# Patient Record
Sex: Female | Born: 1960 | Race: Black or African American | Hispanic: No | Marital: Single | Smoking: Current every day smoker
Health system: Southern US, Community
[De-identification: ages and names within clinical notes are randomized; demographics above are authoritative.]

## PROBLEM LIST (undated history)

## (undated) DIAGNOSIS — I10 Essential (primary) hypertension: Secondary | ICD-10-CM

## (undated) DIAGNOSIS — E119 Type 2 diabetes mellitus without complications: Secondary | ICD-10-CM

## (undated) DIAGNOSIS — U071 COVID-19: Secondary | ICD-10-CM

## (undated) DIAGNOSIS — G8929 Other chronic pain: Secondary | ICD-10-CM

## (undated) HISTORY — PX: BACK SURGERY: SHX140

---

## 2020-06-23 ENCOUNTER — Ambulatory Visit
Admission: EM | Admit: 2020-06-23 | Discharge: 2020-06-23 | Disposition: A | Discharge status: Home / Self Care | Payer: Medicaid Other

## 2020-06-23 ENCOUNTER — Other Ambulatory Visit: Payer: Self-pay

## 2020-06-23 ENCOUNTER — Encounter: Payer: Self-pay | Admitting: Emergency Medicine

## 2020-06-23 DIAGNOSIS — J069 Acute upper respiratory infection, unspecified: Secondary | ICD-10-CM | POA: Diagnosis not present

## 2020-06-23 HISTORY — DX: Essential (primary) hypertension: I10

## 2020-06-23 HISTORY — DX: Type 2 diabetes mellitus without complications: E11.9

## 2020-06-23 HISTORY — DX: Other chronic pain: G89.29

## 2020-06-23 MED ORDER — DEXAMETHASONE SODIUM PHOSPHATE 10 MG/ML IJ SOLN
10.0000 mg | Freq: Once | INTRAMUSCULAR | Status: AC
  Administered 2020-06-23: 10 mg via INTRAMUSCULAR

## 2020-06-23 MED ORDER — GUAIFENESIN-CODEINE 100-10 MG/5ML PO SOLN: 5.0000 mL | Freq: Every evening | ORAL | 0 refills | Status: DC | PRN

## 2020-06-23 MED ORDER — BENZONATATE 200 MG PO CAPS: 200.0000 mg | ORAL_CAPSULE | Freq: Three times a day (TID) | ORAL | 0 refills | Status: DC | PRN

## 2020-06-23 MED ORDER — ALBUTEROL SULFATE HFA 108 (90 BASE) MCG/ACT IN AERS
2.0000 | INHALATION_SPRAY | Freq: Once | RESPIRATORY_TRACT | Status: AC
  Administered 2020-06-23: 2 via RESPIRATORY_TRACT

## 2020-06-23 MED ORDER — PREDNISONE 20 MG PO TABS: 40.0000 mg | ORAL_TABLET | Freq: Every day | ORAL | 0 refills | Status: DC

## 2020-06-23 NOTE — ED Triage Notes (Signed)
Pt here with cough and congestion; pain with cough; pt sts started yesterday; pt sts fever this am; none noted at present; pt speaking is whisper at present

## 2020-06-23 NOTE — Discharge Instructions (Addendum)
We gave you a shot of Decadron, continue with prednisone 40 mg daily for 5 days, begin tomorrow morning Albuterol inhaler 1 to 2 puffs every 4-6 hours as needed for shortness of breath, chest tightness and wheezing Continue Flonase and loratadine Tessalon every 8 hours for cough during the day Robitussin with codeine at bedtime Rest and fluids Follow-up if not improving or worsening

## 2020-06-24 ENCOUNTER — Telehealth: Payer: Self-pay | Admitting: Emergency Medicine

## 2020-06-24 MED ORDER — BENZONATATE 200 MG PO CAPS: 200.0000 mg | ORAL_CAPSULE | Freq: Three times a day (TID) | ORAL | 0 refills | Status: AC | PRN

## 2020-06-24 MED ORDER — GUAIFENESIN-CODEINE 100-10 MG/5ML PO SOLN: 5.0000 mL | Freq: Every evening | ORAL | 0 refills | Status: AC | PRN

## 2020-06-24 MED ORDER — PREDNISONE 20 MG PO TABS: 40.0000 mg | ORAL_TABLET | Freq: Every day | ORAL | 0 refills | Status: AC

## 2020-06-24 NOTE — Telephone Encounter (Signed)
Resending rx for visit yesterday

## 2020-06-24 NOTE — ED Provider Notes (Signed)
[name]    CSN: [id number] Arrival date & time: 06/23/20  1453      History   Chief Complaint Chief Complaint  Patient presents with  . Cough    HPI Tracy Wall is a 60 y.o. female history of hypertension, diabetes presenting today for evaluation of cough.  Reports reading yesterday she developed sinus pressure discomfort, congestion and cough.  Reports associated chest discomfort with coughing.  Voice has become hoarse.  Denies history of asthma or underlying lung problems.  Denies any fevers.  Reports she is new to the area and believe this may be related to the pollen as well as she recently was moving and around a lot of boxes/dust.  HPI  Past Medical History:  Diagnosis Date  . Chronic pain   . Diabetes mellitus without complication (HCC)   . Hypertension     There are no problems to display for this patient.   Past Surgical History:  Procedure Laterality Date  . BACK SURGERY      OB History   No obstetric history on file.      Home Medications    Prior to Admission medications   Medication Sig Start Date End Date Taking? Authorizing Provider  amLODipine (NORVASC) 10 MG tablet Take 10 mg by mouth daily.   Yes [provider]  aspirin 81 MG chewable tablet Chew 81 mg by mouth daily.   Yes [provider]  atorvastatin (LIPITOR) 20 MG tablet Take 20 mg by mouth daily.   Yes [provider]  gabapentin (NEURONTIN) 100 MG capsule Take 100 mg by mouth 3 (three) times daily.   Yes [provider]  levocetirizine (XYZAL) 5 MG tablet Take 5 mg by mouth every evening.   Yes [provider]  metFORMIN (GLUCOPHAGE) 500 MG tablet Take by mouth 2 (two) times daily with a meal.   Yes [provider]  metoprolol succinate (TOPROL-XL) 100 MG 24 hr tablet Take 100 mg by mouth daily. Take with or immediately following a meal.   Yes [provider]  benzonatate (TESSALON) 200 MG capsule Take 1  capsule (200 mg total) by mouth 3 (three) times daily as needed for up to 7 days for cough. 06/23/20 06/30/20  Shuronda Santino C, PA-C  guaiFENesin-codeine 100-10 MG/5ML syrup Take 5-10 mLs by mouth at bedtime as needed for cough. 06/23/20   Merary Garguilo C, PA-C  predniSONE (DELTASONE) 20 MG tablet Take 2 tablets (40 mg total) by mouth daily with breakfast for 5 days. 06/23/20 06/28/20  Ares Tegtmeyer, Junius Creamer, PA-C    Family History Family History  Family history unknown: Yes    Social History Social History   Tobacco Use  . Smoking status: Current Every Day Smoker  . Smokeless tobacco: Never Used  Substance Use Topics  . Alcohol use: Not Currently  . Drug use: Never     Allergies   Patient has no known allergies.   Review of Systems Review of Systems  Constitutional: Negative for activity change, appetite change, chills, fatigue and fever.  HENT: Positive for congestion and rhinorrhea. Negative for ear pain, sinus pressure, sore throat and trouble swallowing.   Eyes: Negative for discharge and redness.  Respiratory: Positive for cough and chest tightness. Negative for shortness of breath.   Cardiovascular: Negative for chest pain.  Gastrointestinal: Negative for abdominal pain, diarrhea, nausea and vomiting.  Musculoskeletal: Negative for myalgias.  Skin: Negative for rash.  Neurological: Negative for dizziness, light-headedness and headaches.  Physical Exam Triage Vital Signs ED Triage Vitals [06/23/20 1503]  Enc Vitals Group     BP 122/71     Pulse Rate 84     Resp 20     Temp 98.3 F (36.8 C)     Temp Source Oral     SpO2 95 %     Weight      Height      Head Circumference      Peak Flow      Pain Score 10     Pain Loc      Pain Edu?      Excl. in GC?    No data found.  Updated Vital Signs BP 122/71 (BP Location: Right Arm)   Pulse 84   Temp 98.3 F (36.8 C) (Oral)   Resp 20   SpO2 95%   Visual Acuity Right Eye Distance:   Left Eye Distance:    Bilateral Distance:    Right Eye Near:   Left Eye Near:    Bilateral Near:     Physical Exam Vitals and nursing note reviewed.  Constitutional:      Appearance: She is well-developed.     Comments: No acute distress  HENT:     Head: Normocephalic and atraumatic.     Ears:     Comments: Bilateral ears without tenderness to palpation of external auricle, tragus and mastoid, EAC's without erythema or swelling, TM's with good bony landmarks and cone of light. Non erythematous.     Nose: Nose normal.     Mouth/Throat:     Comments: Oral mucosa pink and moist, no tonsillar enlargement or exudate. Posterior pharynx patent and nonerythematous, no uvula deviation or swelling. Normal phonation. Eyes:     Conjunctiva/sclera: Conjunctivae normal.  Cardiovascular:     Rate and Rhythm: Normal rate.  Pulmonary:     Effort: Pulmonary effort is normal. No respiratory distress.     Comments: Breathing comfortably at rest, CTABL, wheeze/bronchospasm noted with coughing Abdominal:     General: There is no distension.  Musculoskeletal:        General: Normal range of motion.     Cervical back: Neck supple.  Skin:    General: Skin is warm and dry.  Neurological:     Mental Status: She is alert and oriented to person, place, and time.      UC Treatments / Results  Labs (all labs ordered are listed, but only abnormal results are displayed) Labs Reviewed  NOVEL CORONAVIRUS, NAA    EKG   Radiology No results found.  Procedures Procedures (including critical care time)  Medications Ordered in UC Medications  albuterol (VENTOLIN HFA) 108 (90 Base) MCG/ACT inhaler 2 puff (2 puffs Inhalation Given 06/23/20 1625)  dexamethasone (DECADRON) injection 10 mg (10 mg Intramuscular Given 06/23/20 1625)    Initial Impression / Assessment and Plan / UC Course  I have reviewed the triage vital signs and the nursing notes.  Pertinent labs & imaging results that were available during my care of  the patient were reviewed by me and considered in my medical decision making (see chart for details).     Viral URI with cough, possible early bronchitis- recommending symptomatic and supportive care, COVID test pending, provided albuterol inhaler and dose of Decadron prior to discharge as well as continuing on Tessalon for daytime cough, Robitussin with codeine for bedtime.  Discussed further recommendations.  Continue to monitor breathing and symptoms,Discussed strict return precautions. Patient verbalized understanding and  is agreeable with plan.  Final Clinical Impressions(s) / UC Diagnoses   Final diagnoses:  Viral URI with cough     Discharge Instructions     We gave you a shot of Decadron, continue with prednisone 40 mg daily for 5 days, begin tomorrow morning Albuterol inhaler 1 to 2 puffs every 4-6 hours as needed for shortness of breath, chest tightness and wheezing Continue Flonase and loratadine Tessalon every 8 hours for cough during the day Robitussin with codeine at bedtime Rest and fluids Follow-up if not improving or worsening     ED Prescriptions    Medication Sig Dispense Auth. Provider   benzonatate (TESSALON) 200 MG capsule  (Status: Discontinued) Take 1 capsule (200 mg total) by mouth 3 (three) times daily as needed for up to 7 days for cough. 28 capsule Bader Stubblefield C, PA-C   guaiFENesin-codeine 100-10 MG/5ML syrup  (Status: Discontinued) Take 5-10 mLs by mouth at bedtime as needed for cough. 120 mL Ignacio Lowder C, PA-C   predniSONE (DELTASONE) 20 MG tablet  (Status: Discontinued) Take 2 tablets (40 mg total) by mouth daily with breakfast for 5 days. 10 tablet Faizan Geraci C, PA-C   benzonatate (TESSALON) 200 MG capsule Take 1 capsule (200 mg total) by mouth 3 (three) times daily as needed for up to 7 days for cough. 28 capsule Destanee Bedonie C, PA-C   guaiFENesin-codeine 100-10 MG/5ML syrup Take 5-10 mLs by mouth at bedtime as needed for cough. 120 mL  Marguerite Jarboe C, PA-C   predniSONE (DELTASONE) 20 MG tablet Take 2 tablets (40 mg total) by mouth daily with breakfast for 5 days. 10 tablet Moustafa Mossa, [place] C, PA-C     PDMP not reviewed this encounter.   Lew Dawes, [place] 06/24/20 [phone]

## 2020-06-25 LAB — NOVEL CORONAVIRUS, NAA: SARS-CoV-2, NAA: NOT DETECTED

## 2020-06-25 LAB — SARS-COV-2, NAA 2 DAY TAT

## 2021-04-18 ENCOUNTER — Other Ambulatory Visit: Payer: Self-pay

## 2021-04-18 ENCOUNTER — Emergency Department (HOSPITAL_COMMUNITY): Payer: Medicaid Other

## 2021-04-18 ENCOUNTER — Encounter (HOSPITAL_COMMUNITY): Payer: Self-pay

## 2021-04-18 ENCOUNTER — Emergency Department (HOSPITAL_COMMUNITY)
Admission: EM | Admit: 2021-04-18 | Discharge: 2021-04-18 | Discharge status: Against Medical Advice | Payer: Medicaid Other | Attending: Emergency Medicine | Admitting: Emergency Medicine

## 2021-04-18 DIAGNOSIS — R0981 Nasal congestion: Secondary | ICD-10-CM | POA: Insufficient documentation

## 2021-04-18 DIAGNOSIS — R0602 Shortness of breath: Secondary | ICD-10-CM | POA: Diagnosis not present

## 2021-04-18 DIAGNOSIS — R002 Palpitations: Secondary | ICD-10-CM | POA: Insufficient documentation

## 2021-04-18 DIAGNOSIS — R0789 Other chest pain: Secondary | ICD-10-CM | POA: Diagnosis not present

## 2021-04-18 DIAGNOSIS — R059 Cough, unspecified: Secondary | ICD-10-CM | POA: Insufficient documentation

## 2021-04-18 DIAGNOSIS — R519 Headache, unspecified: Secondary | ICD-10-CM | POA: Insufficient documentation

## 2021-04-18 DIAGNOSIS — Z5321 Procedure and treatment not carried out due to patient leaving prior to being seen by health care provider: Secondary | ICD-10-CM | POA: Diagnosis not present

## 2021-04-18 HISTORY — DX: COVID-19: U07.1

## 2021-04-18 LAB — CBC
HCT: 35.7 % — ABNORMAL LOW (ref 36.0–46.0)
Hemoglobin: 11.1 g/dL — ABNORMAL LOW (ref 12.0–15.0)
MCH: 23.6 pg — ABNORMAL LOW (ref 26.0–34.0)
MCHC: 31.1 g/dL (ref 30.0–36.0)
MCV: 75.8 fL — ABNORMAL LOW (ref 80.0–100.0)
Platelets: 267 10*3/uL (ref 150–400)
RBC: 4.71 MIL/uL (ref 3.87–5.11)
RDW: 18.2 % — ABNORMAL HIGH (ref 11.5–15.5)
WBC: 8 10*3/uL (ref 4.0–10.5)
nRBC: 0 % (ref 0.0–0.2)

## 2021-04-18 LAB — TROPONIN I (HIGH SENSITIVITY): Troponin I (High Sensitivity): 4 ng/L (ref <18)

## 2021-04-18 LAB — BASIC METABOLIC PANEL
Anion gap: 12 (ref 5–15)
BUN: 11 mg/dL (ref 6–20)
CO2: 24 mmol/L (ref 22–32)
Calcium: 10 mg/dL (ref 8.9–10.3)
Chloride: 103 mmol/L (ref 98–111)
Creatinine, Ser: 0.66 mg/dL (ref 0.44–1.00)
GFR, Estimated: >60 mL/min (ref >60)
Glucose, Bld: 99 mg/dL (ref 70–99)
Potassium: 3.8 mmol/L (ref 3.5–5.1)
Sodium: 139 mmol/L (ref 135–145)

## 2021-04-18 NOTE — ED Provider Triage Note (Signed)
Emergency Medicine Provider Triage Evaluation Note ? ?Tracy Wall , a 61 y.o. female  was evaluated in triage.  Pt complains of chest pain.  Patient states that she has had a upper respiratory infection beginning Sunday.  She states that on Tuesday she began feeling like she was having palpitations.  She states that now she is having central chest pain that she describes as severe.  She states that it is worse with coughing and when she is pressing on her chest.  She states that she does however have chest pain when she is not doing either of those things.  Also endorses shortness of breath.  She endorses ongoing headache, cough, congestion.  Denies sore throat.  Denies ongoing fevers. ? ?Review of Systems  ?Positive:  ?Negative:  ? ?Physical Exam  ?BP 139/77   Pulse 77   Temp 98.5 ?F (36.9 ?C) (Oral)   Resp 16   Ht 5\' 4"  (1.626 m)   Wt 94.8 kg   SpO2 95%   BMI 35.87 kg/m?  ?Gen:   Awake, no distress   ?Resp:  Normal effort, lungs clear bilaterally ?MSK:   Moves extremities without difficulty  ?Other:  S1/S2 without murmur ? ?Medical Decision Making  ?Medically screening exam initiated at 6:31 PM.  Appropriate orders placed.  Atavia Poppe was informed that the remainder of the evaluation will be completed by another provider, this initial triage assessment does not replace that evaluation, and the importance of remaining in the ED until their evaluation is complete. ? ? ?  ?Sherri Sear, PA-C ?04/18/21 1832 ? ?

## 2021-04-18 NOTE — ED Notes (Signed)
Pt LWBS, per registration ?

## 2021-04-18 NOTE — ED Triage Notes (Signed)
Pt arrived via POV for c/o left sided chest pain upon palpation and states she feels like she's having palpationsx3 days. Pt states earlier this week she had some SOB and nausea, but denies currently. VSS. ?

## 2021-04-19 ENCOUNTER — Other Ambulatory Visit: Payer: Self-pay

## 2021-04-19 ENCOUNTER — Encounter (HOSPITAL_COMMUNITY): Payer: Self-pay | Admitting: Emergency Medicine

## 2021-04-19 ENCOUNTER — Emergency Department (HOSPITAL_COMMUNITY)
Admission: EM | Admit: 2021-04-19 | Discharge: 2021-04-19 | Disposition: A | Discharge status: Home / Self Care | Payer: Medicaid Other | Attending: Emergency Medicine | Admitting: Emergency Medicine

## 2021-04-19 ENCOUNTER — Emergency Department (HOSPITAL_COMMUNITY): Payer: Medicaid Other

## 2021-04-19 DIAGNOSIS — R0789 Other chest pain: Secondary | ICD-10-CM | POA: Diagnosis present

## 2021-04-19 DIAGNOSIS — I10 Essential (primary) hypertension: Secondary | ICD-10-CM | POA: Diagnosis not present

## 2021-04-19 DIAGNOSIS — Z20822 Contact with and (suspected) exposure to covid-19: Secondary | ICD-10-CM | POA: Insufficient documentation

## 2021-04-19 DIAGNOSIS — Z79899 Other long term (current) drug therapy: Secondary | ICD-10-CM | POA: Insufficient documentation

## 2021-04-19 DIAGNOSIS — E119 Type 2 diabetes mellitus without complications: Secondary | ICD-10-CM | POA: Diagnosis not present

## 2021-04-19 DIAGNOSIS — Z7984 Long term (current) use of oral hypoglycemic drugs: Secondary | ICD-10-CM | POA: Insufficient documentation

## 2021-04-19 DIAGNOSIS — Z7982 Long term (current) use of aspirin: Secondary | ICD-10-CM | POA: Insufficient documentation

## 2021-04-19 LAB — RESP PANEL BY RT-PCR (FLU A&B, COVID) ARPGX2
Influenza A by PCR: NEGATIVE
Influenza B by PCR: NEGATIVE
SARS Coronavirus 2 by RT PCR: NEGATIVE

## 2021-04-19 LAB — CBC
HCT: 38.5 % (ref 36.0–46.0)
Hemoglobin: 11.7 g/dL — ABNORMAL LOW (ref 12.0–15.0)
MCH: 23.3 pg — ABNORMAL LOW (ref 26.0–34.0)
MCHC: 30.4 g/dL (ref 30.0–36.0)
MCV: 76.5 fL — ABNORMAL LOW (ref 80.0–100.0)
Platelets: 287 10*3/uL (ref 150–400)
RBC: 5.03 MIL/uL (ref 3.87–5.11)
RDW: 18.2 % — ABNORMAL HIGH (ref 11.5–15.5)
WBC: 6 10*3/uL (ref 4.0–10.5)
nRBC: 0 % (ref 0.0–0.2)

## 2021-04-19 LAB — BASIC METABOLIC PANEL
Anion gap: 12 (ref 5–15)
BUN: 10 mg/dL (ref 6–20)
CO2: 23 mmol/L (ref 22–32)
Calcium: 9.7 mg/dL (ref 8.9–10.3)
Chloride: 105 mmol/L (ref 98–111)
Creatinine, Ser: 0.73 mg/dL (ref 0.44–1.00)
GFR, Estimated: >60 mL/min (ref >60)
Glucose, Bld: 125 mg/dL — ABNORMAL HIGH (ref 70–99)
Potassium: 3.9 mmol/L (ref 3.5–5.1)
Sodium: 140 mmol/L (ref 135–145)

## 2021-04-19 LAB — TROPONIN I (HIGH SENSITIVITY)
Troponin I (High Sensitivity): 4 ng/L (ref <18)
Troponin I (High Sensitivity): 4 ng/L (ref <18)

## 2021-04-19 LAB — I-STAT BETA HCG BLOOD, ED (MC, WL, AP ONLY): I-stat hCG, quantitative: <5 m[IU]/mL (ref <5)

## 2021-04-19 MED ORDER — MELOXICAM 15 MG PO TABS: 15.0000 mg | ORAL_TABLET | Freq: Every day | ORAL | 0 refills | Status: AC

## 2021-04-19 MED ORDER — HYDROCODONE-ACETAMINOPHEN 5-325 MG PO TABS
1.0000 | ORAL_TABLET | Freq: Once | ORAL | Status: AC
  Administered 2021-04-19: 1 via ORAL
  Filled 2021-04-19: qty 1

## 2021-04-19 MED ORDER — HYDROCODONE-ACETAMINOPHEN 5-325 MG PO TABS: 1.0000 | ORAL_TABLET | ORAL | 0 refills | Status: AC | PRN

## 2021-04-19 NOTE — Discharge Instructions (Addendum)
You were seen today for chest pain. Your lab results and EKG are reassuring that your pain is not cardiac in nature. Based on your physical exam and the recent history of cough I believe you most likely have costochondritis, an inflammation of the cartilage near your sternum (breast bone).  This is treated with hot or cold packs, stretching, topical Voltaren (diclofenac gel), NSAIDs, and Tylenol. I have prescribed Meloxicam which is a once a day NSAID. Do not use other NSAID products while taking the medication. I have also prescribed 6 Norco to be used as needed for more severe pain. You may still take Tylenol but do not exceed 3000mg  Tylenol from all sources.  I recommend follow up with a primary care provider in 4-6 weeks. Return to the emergency department if you develop life threatening conditions such as chest pain, shortness of breath, or altered level of consciousness.  ?

## 2021-04-19 NOTE — ED Triage Notes (Signed)
Patient coming from home complaint of chest pain for approx 1 week.  ?

## 2021-04-19 NOTE — ED Provider Notes (Signed)
[facility] EMERGENCY DEPARTMENT Provider Note   CSN: [id number] Arrival date & time: 04/19/21  [id number]     History  Chief Complaint  Patient presents with   Chest Pain    Tracy Wall is a 61 y.o. female. The patient complains of substernal chest pain that has been ongoing for one week.  She states that she had a respiratory infection that began Saturday and she has been vigorously coughing. The pain does not radiate. It is reproducible by movement. She complains of tenderness to palpation of the area. She denies shortness of breath. Denies nausea or vomiting. Denies abdominal pain.  Denies urinary symptoms. PMH significant for HTN, DM, chronic pain. Patient is a current everyday smoker. Patient does not know her family history  HPI     Home Medications Prior to Admission medications   Medication Sig Start Date End Date Taking? Authorizing Provider  HYDROcodone-acetaminophen (NORCO/VICODIN) 5-325 MG tablet Take 1 tablet by mouth every 4 (four) hours as needed for moderate pain. 04/19/21  Yes Dorothyann Peng, PA-C  meloxicam (MOBIC) 15 MG tablet Take 1 tablet (15 mg total) by mouth daily. 04/19/21 05/19/21 Yes Dorothyann Peng, PA-C  amLODipine (NORVASC) 10 MG tablet Take 10 mg by mouth daily.    [provider]  aspirin 81 MG chewable tablet Chew 81 mg by mouth daily.    [provider]  atorvastatin (LIPITOR) 20 MG tablet Take 20 mg by mouth daily.    [provider]  gabapentin (NEURONTIN) 100 MG capsule Take 100 mg by mouth 3 (three) times daily.    [provider]  guaiFENesin-codeine 100-10 MG/5ML syrup Take 5-10 mLs by mouth at bedtime as needed for cough. 06/24/20   Wieters, Hallie C, PA-C  levocetirizine (XYZAL) 5 MG tablet Take 5 mg by mouth every evening.    [provider]  metFORMIN (GLUCOPHAGE) 500 MG tablet Take by mouth 2 (two) times daily with a meal.    [provider]  metoprolol succinate  (TOPROL-XL) 100 MG 24 hr tablet Take 100 mg by mouth daily. Take with or immediately following a meal.    [provider]      Allergies    Patient has no known allergies.    Review of Systems   Review of Systems  Constitutional:  Negative for fever.  Respiratory:  Positive for cough. Negative for shortness of breath.   Cardiovascular:  Positive for chest pain. Negative for leg swelling.  Gastrointestinal:  Negative for abdominal pain, nausea and vomiting.  Genitourinary:  Negative for dysuria.  Musculoskeletal:  Positive for back pain (Chronic).   Physical Exam Updated Vital Signs BP 108/75    Pulse 80    Temp 98.2 F (36.8 C) (Oral)    Resp 13    SpO2 97%  Physical Exam Constitutional:      General: She is not in acute distress. HENT:     Head: Normocephalic.  Eyes:     Pupils: Pupils are equal, round, and reactive to light.  Cardiovascular:     Rate and Rhythm: Normal rate and regular rhythm.     Heart sounds: Normal heart sounds.  Pulmonary:     Effort: Pulmonary effort is normal. No respiratory distress.     Breath sounds: Normal breath sounds.  Chest:     Chest wall: Tenderness (Sternal) present.  Abdominal:     Palpations: Abdomen is soft.     Tenderness: There is no abdominal tenderness.  Musculoskeletal:  Cervical back: Normal range of motion.  Skin:    General: Skin is warm and dry.  Neurological:     Mental Status: She is alert.    ED Results / Procedures / Treatments   Labs (all labs ordered are listed, but only abnormal results are displayed) Labs Reviewed  BASIC METABOLIC PANEL - Abnormal; Notable for the following components:      Result Value   Glucose, Bld 125 (*)    All other components within normal limits  CBC - Abnormal; Notable for the following components:   Hemoglobin 11.7 (*)    MCV 76.5 (*)    MCH 23.3 (*)    RDW 18.2 (*)    All other components within normal limits  RESP PANEL BY RT-PCR (FLU A&B, COVID) ARPGX2  I-STAT  BETA HCG BLOOD, ED ([facility], [facility], [facility] ONLY)  TROPONIN I (HIGH SENSITIVITY)  TROPONIN I (HIGH SENSITIVITY)    EKG EKG Interpretation  Date/Time:  Friday April 19 2021 09:45:51 EST Ventricular Rate:  85 PR Interval:  150 QRS Duration: 86 QT Interval:  360 QTC Calculation: 428 R Axis:   64 Text Interpretation: Normal sinus rhythm Normal ECG When compared with ECG of 18-Apr-2021 18:17, PREVIOUS ECG IS PRESENT Confirmed by Lennice Sites ([id number]) on 04/19/2021 10:29:37 AM  Radiology DG Chest 2 View  Result Date: 04/19/2021 CLINICAL DATA:  Chest pain. EXAM: CHEST - 2 VIEW COMPARISON:  April 18, 2021. FINDINGS: The heart size and mediastinal contours are within normal limits. Both lungs are clear. The visualized skeletal structures are unremarkable. IMPRESSION: No active cardiopulmonary disease. Electronically Signed   By: Marijo Conception M.D.   On: 04/19/2021 10:23   DG Chest 2 View  Result Date: 04/18/2021 CLINICAL DATA:  Chest pain. EXAM: CHEST - 2 VIEW COMPARISON:  None. FINDINGS: The heart size and mediastinal contours are within normal limits. Both lungs are clear. Cervical and lumbar fusion hardware present. IMPRESSION: No active cardiopulmonary disease. Electronically Signed   By: Ronney Asters M.D.   On: 04/18/2021 18:49    Procedures .1-3 Lead EKG Interpretation Performed by: Dorothyann Peng, PA-C Authorized by: Dorothyann Peng, PA-C     Interpretation: normal     ECG rate assessment: normal     Rhythm: sinus rhythm     Ectopy: none     Conduction: normal      Medications Ordered in ED Medications  HYDROcodone-acetaminophen (NORCO/VICODIN) 5-325 MG per tablet 1 tablet (1 tablet Oral Given 04/19/21 1225)    ED Course/ Medical Decision Making/ A&P                           Medical Decision Making Amount and/or Complexity of Data Reviewed Labs: ordered. Radiology: ordered.   This patient presents to the ED for concern of chest pain, this involves an extensive number of  treatment options, and is a complaint that carries with it a high risk of complications and morbidity.  The differential diagnosis includes but is not limited to ACS, PE, pneumonia, costochondritis, and others   Co morbidities that complicate the patient evaluation  Chronic pain   Additional history obtained:   External records from outside source obtained and reviewed including urgent care notes and notes from care everywhere reviewing patient history   Lab Tests:  I Ordered, and personally interpreted labs.  The pertinent results include:  Troponin I 4, repeat Troponin 4, HgB 11.7 (Consistent with previous labs), negative Covid,  Flu A, Flu B   Imaging Studies ordered:  I ordered imaging studies including Chest x-ray  I independently visualized and interpreted imaging which showed no active disease I agree with the radiologist interpretation   Cardiac Monitoring:  The patient was maintained on a cardiac monitor.  I personally viewed and interpreted the cardiac monitored which showed an underlying rhythm of: normal sinus rhythm   Medicines ordered and prescription drug management:  I ordered medication including oxycodone for pain  Reevaluation of the patient after these medicines showed that the patient improved I have reviewed the patients home medicines and have made adjustments as needed   Test Considered:  CT angio chest     Presentation not indicative of PE. Wells score of 0. I see no reason to pursue CT angio chest for PE r/o. Chest x-ray clear. No white count. My clinical suspicion for pneumonia or PE is very low. EKG shows no ischemic pattern. Repeat troponin is 4. My suspicion for ACS is very low. The patient most likely has costochondritis due to her recent coughing. There is no indication for admission at this time. I recommend discharge home with Tylenol, meloxicam, 6 Norco, and diclofenac gel as needed. She may follow up with her PCP. Return precautions  provided.   Final Clinical Impression(s) / ED Diagnoses Final diagnoses:  Chest wall pain    Rx / DC Orders ED Discharge Orders          Ordered    meloxicam (MOBIC) 15 MG tablet  Daily        04/19/21 1346    HYDROcodone-acetaminophen (NORCO/VICODIN) 5-325 MG tablet  Every 4 hours PRN        04/19/21 1346              Ronny Bacon 04/19/21 1348    Lennice Sites, DO 04/19/21 1438

## 2023-03-20 IMAGING — DX DG CHEST 2V
2 series · 2 of 2 positions shown · non-contrast
Comparison: April 18, 2021.

CLINICAL DATA: Chest pain.

EXAM:
CHEST - 2 VIEW

[chest pa]
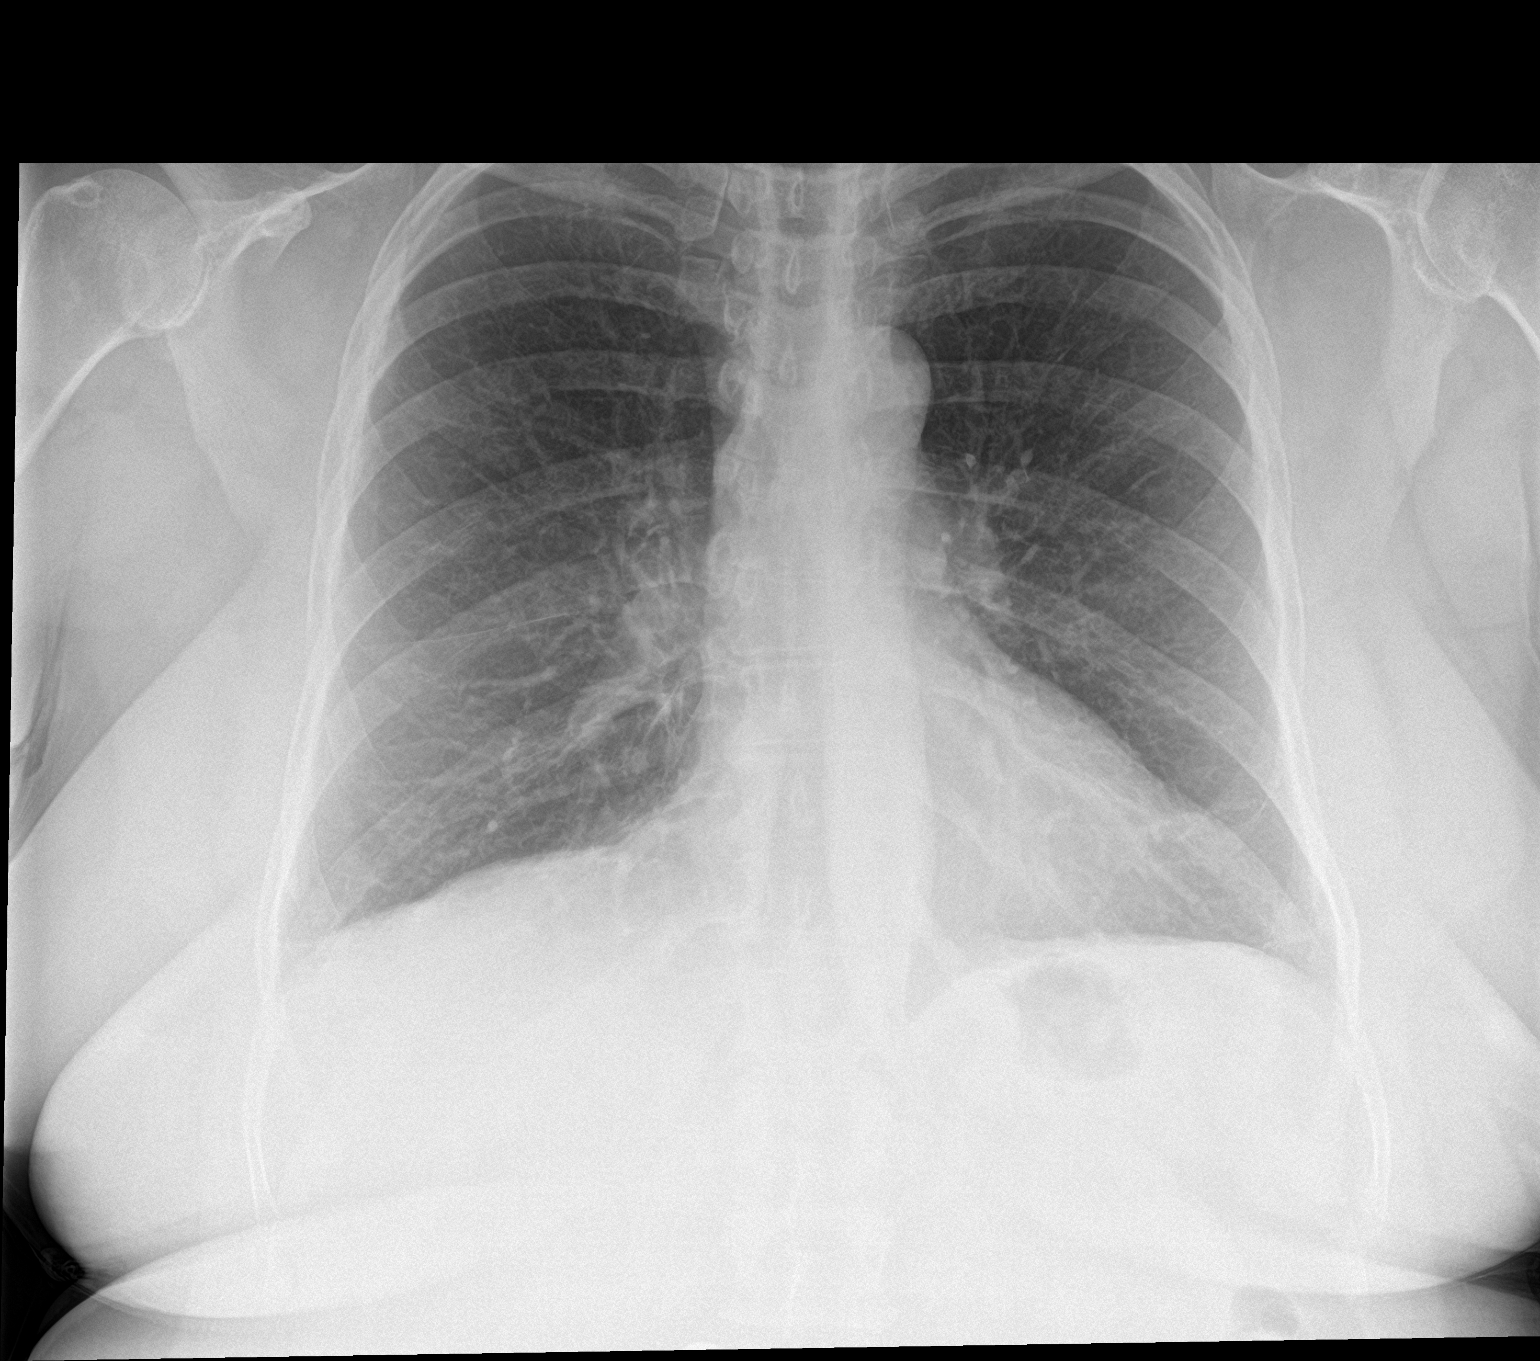

[chest lat]
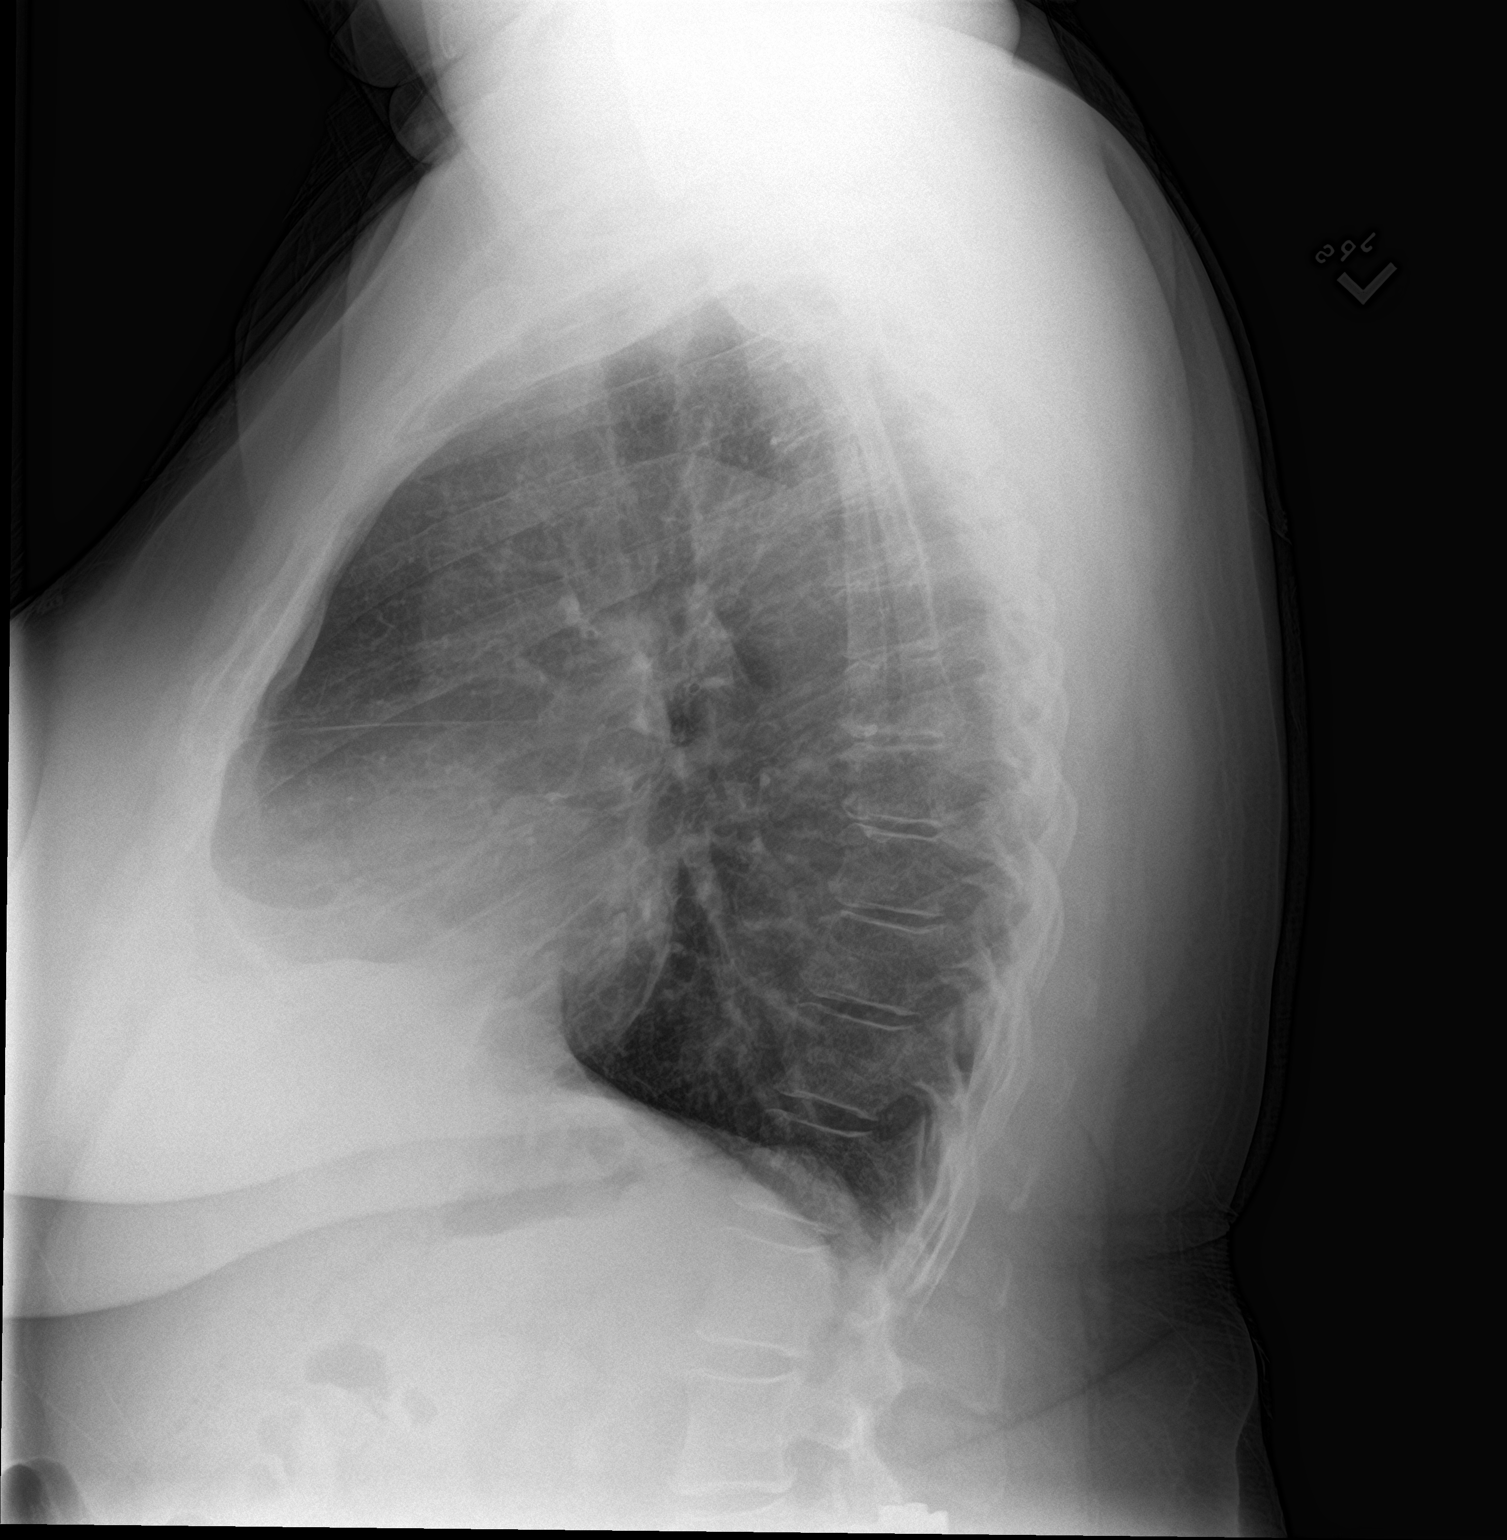

[2 of 2 positions shown; findings below may reference images not displayed]

FINDINGS: The heart size and mediastinal contours are within normal limits.
Both lungs are clear. The visualized skeletal structures are
unremarkable.
IMPRESSION: No active cardiopulmonary disease.
